# Patient Record
Sex: Female | Born: 2005 | Race: Black or African American | Hispanic: No | Marital: Single | Smoking: Never smoker
Health system: Southern US, Community
[De-identification: ages and names within clinical notes are randomized; demographics above are authoritative.]

## PROBLEM LIST (undated history)

## (undated) DIAGNOSIS — L700 Acne vulgaris: Secondary | ICD-10-CM

## (undated) DIAGNOSIS — L305 Pityriasis alba: Secondary | ICD-10-CM

## (undated) DIAGNOSIS — J3089 Other allergic rhinitis: Secondary | ICD-10-CM

## (undated) DIAGNOSIS — K219 Gastro-esophageal reflux disease without esophagitis: Secondary | ICD-10-CM

## (undated) DIAGNOSIS — H1045 Other chronic allergic conjunctivitis: Secondary | ICD-10-CM

## (undated) DIAGNOSIS — J189 Pneumonia, unspecified organism: Secondary | ICD-10-CM

## (undated) HISTORY — DX: Acne vulgaris: L70.0

## (undated) HISTORY — DX: Pityriasis alba: L30.5

## (undated) HISTORY — DX: Gastro-esophageal reflux disease without esophagitis: K21.9

## (undated) HISTORY — DX: Other chronic allergic conjunctivitis: H10.45

## (undated) HISTORY — DX: Other allergic rhinitis: J30.89

---
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
## 2024-02-06 ENCOUNTER — Encounter: Payer: Self-pay | Admitting: Family Medicine

## 2024-02-07 MED ORDER — FLUTICASONE PROPIONATE 50 MCG/ACT NA SUSP: 2.0000 | Freq: Every day | NASAL | 0 refills | Status: AC

## 2024-02-07 MED ORDER — LEVOCETIRIZINE DIHYDROCHLORIDE 5 MG PO TABS: 5.0000 mg | ORAL_TABLET | Freq: Every evening | ORAL | 3 refills | Status: AC

## 2024-02-07 MED ORDER — MONTELUKAST SODIUM 10 MG PO TABS: 10.0000 mg | ORAL_TABLET | Freq: Every day | ORAL | 3 refills | Status: AC

## 2024-02-07 NOTE — Telephone Encounter (Signed)
 Singulair, xyzal, and flonase all refilled today. Also can add generic zaditor over the counter antihistamine eye drops as needed if eyes severe. -thx

## 2024-02-09 ENCOUNTER — Ambulatory Visit: Payer: Self-pay | Admitting: Family Medicine

## 2024-05-03 ENCOUNTER — Encounter: Payer: Self-pay | Admitting: Family Medicine
